# Patient Record
Sex: Male | Born: 1980
Health system: Southern US, Community
[De-identification: ages and names within clinical notes are randomized; demographics above are authoritative.]

## PROBLEM LIST (undated history)

## (undated) HISTORY — PX: BACK SURGERY: SHX140

---

## 2007-10-18 ENCOUNTER — Ambulatory Visit (HOSPITAL_COMMUNITY): Admission: RE | Admit: 2007-10-18 | Discharge: 2007-10-19 | Payer: Self-pay | Admitting: Orthopedic Surgery

## 2010-11-09 NOTE — Op Note (Signed)
NAME:  Zachary Lee, Zachary Lee             ACCOUNT NO.:  [id number]   MEDICAL RECORD NO.:  [id number]          PATIENT TYPE:  AMB   LOCATION:  DAY                          FACILITY:  [facility]   PHYSICIAN:  Georges Lynch. Gioffre, M.D.DATE OF BIRTH:  [birth year]   DATE OF PROCEDURE:  10/18/2007  DATE OF DISCHARGE:                               OPERATIVE REPORT   SURGEON:  Georges Lynch. Darrelyn Hillock, M.D.   ASSISTANT:  Marlowe Kays, M.D.   PREOPERATIVE DIAGNOSIS:  Large herniated lumbar disc at L5-S1 on the  left.  Note, he had left leg pain only along the S1 nerve root  distribution.   POSTOPERATIVE DIAGNOSIS:  Large herniated lumbar disc at L5-S1 on the  left.  Note, he had left leg pain only along the S1 nerve root  distribution.   OPERATION:  Hemilaminectomy and microdiscectomy at L5-S1 on the left,  herniated disc.   PROCEDURE:  Under general anesthesia, routine orthopedic prep and  draping of the lower back was carried out.  With the patient on a spinal  frame, he first had 1 gram of IV Ancef.  At this time, two needles were  placed in the back for localization purposes and an x-ray was taken.  I  then proceeded down over the L5-S1 interspace and the incision was made  there and the muscle was separated from the lamina and spinous process  in the usual fashion.  We separated some of the muscle from the opposite  side in order to put the [facility] retractor blades in for retraction.  Another x-ray was taken to verify the exact space.  We then carried out  our hemilaminectomy in the usual fashion.  We did a foraminotomy for the  S1 root.  We brought the microscope in and gently removed the ligamentum  flavum which was very thick in this area.  Following that, we  decompressed the lateral recess, cauterized the lateral recess veins,  and immediately went down and identified the disc which was an extremely  large herniated disc.  We gently retracted the dura and the nerve root  in the usual  fashion.  A cruciate incision was made in the disc space  and prior to doing that, we noted one loose fragment already present.  We teased that out first with a nerve hook.  Then we went down into the  disc space and we went subligamentous with the nerve hook as well as the  Epstein curettes and cleaned out the subligamentous space, as well, and  then went down and completed our discectomy.  Note, we made multiple  passes in the subligamentous space medially, distally, proximally and  out laterally to make sure we had all the fragments.  The posterior  longitudinal ligament was quite thickened, as well.  We had a nice  decompression of the dura and the nerve root.  Both the dura and the  root moved nice and freely and basically, we were able to easily pass a  hockey stick out the foramina of both the L5 and S1 roots.  We irrigated  the area out.  Good hemostasis  was maintained.  We then loosely applied  some thrombin soaked Gelfoam and closed the wound in layers in the usual  fashion except I left the small distal and small proximal deep parts of  the wound open for drainage purposes.  The subcu was closed with 0  Vicryl and the skin was closed with metal staples and a sterile  Neosporin dressing was applied.          ______________________________  Georges Lynch Darrelyn Hillock, M.D.    RAG/MEDQ  D:  10/18/2007  T:  10/18/2007  Job:  [id number]

## 2011-03-22 LAB — PROTIME-INR: Prothrombin Time: 13.6

## 2011-03-22 LAB — DIFFERENTIAL
Basophils Absolute: 0
Basophils Relative: 1
Eosinophils Absolute: 0.1
Eosinophils Relative: 2

## 2011-03-22 LAB — CBC
Hemoglobin: 16
RBC: 5.37
WBC: 4.9

## 2011-03-22 LAB — COMPREHENSIVE METABOLIC PANEL
ALT: 32
AST: 29
Alkaline Phosphatase: 70
CO2: 28
Chloride: 104
GFR calc Af Amer: >60
GFR calc non Af Amer: >60
Glucose, Bld: 99
Sodium: 140
Total Bilirubin: 0.9

## 2011-03-22 LAB — URINALYSIS, ROUTINE W REFLEX MICROSCOPIC
Bilirubin Urine: NEGATIVE
Glucose, UA: NEGATIVE
Hgb urine dipstick: NEGATIVE
Protein, ur: NEGATIVE

## 2011-12-28 ENCOUNTER — Ambulatory Visit
Admission: RE | Admit: 2011-12-28 | Discharge: 2011-12-28 | Disposition: A | Discharge status: Home / Self Care | Payer: BC Managed Care – PPO | Source: Ambulatory Visit | Attending: Orthopedic Surgery | Admitting: Orthopedic Surgery

## 2011-12-28 ENCOUNTER — Other Ambulatory Visit: Payer: Self-pay | Admitting: Orthopedic Surgery

## 2011-12-28 DIAGNOSIS — M546 Pain in thoracic spine: Secondary | ICD-10-CM

## 2013-05-17 ENCOUNTER — Other Ambulatory Visit: Payer: Self-pay | Admitting: Orthopedic Surgery

## 2013-05-17 DIAGNOSIS — M25511 Pain in right shoulder: Secondary | ICD-10-CM

## 2013-06-03 ENCOUNTER — Ambulatory Visit
Admission: RE | Admit: 2013-06-03 | Discharge: 2013-06-03 | Disposition: A | Discharge status: Home / Self Care | Payer: BC Managed Care – PPO | Source: Ambulatory Visit | Attending: Orthopedic Surgery | Admitting: Orthopedic Surgery

## 2013-06-03 DIAGNOSIS — M25511 Pain in right shoulder: Secondary | ICD-10-CM

## 2013-06-03 MED ORDER — IOHEXOL 180 MG/ML  SOLN
15.0000 mL | Freq: Once | INTRAMUSCULAR | Status: AC | PRN
  Administered 2013-06-03: 15 mL via INTRA_ARTICULAR

## 2015-02-17 ENCOUNTER — Other Ambulatory Visit: Payer: Self-pay | Admitting: Internal Medicine

## 2015-02-17 DIAGNOSIS — R1084 Generalized abdominal pain: Secondary | ICD-10-CM

## 2015-02-20 ENCOUNTER — Ambulatory Visit: Payer: Self-pay

## 2015-02-24 ENCOUNTER — Ambulatory Visit
Admission: RE | Admit: 2015-02-24 | Discharge: 2015-02-24 | Disposition: A | Discharge status: Home / Self Care | Payer: BLUE CROSS/BLUE SHIELD | Source: Ambulatory Visit | Attending: Internal Medicine | Admitting: Internal Medicine

## 2015-02-24 DIAGNOSIS — R1084 Generalized abdominal pain: Secondary | ICD-10-CM

## 2015-02-24 MED ORDER — IOPAMIDOL (ISOVUE-300) INJECTION 61%: 100.0000 mL | Freq: Once | INTRAVENOUS | Status: DC | PRN

## 2016-08-18 DIAGNOSIS — G5762 Lesion of plantar nerve, left lower limb: Secondary | ICD-10-CM | POA: Diagnosis not present

## 2016-08-18 DIAGNOSIS — M7752 Other enthesopathy of left foot: Secondary | ICD-10-CM | POA: Diagnosis not present

## 2016-09-09 DIAGNOSIS — G5762 Lesion of plantar nerve, left lower limb: Secondary | ICD-10-CM | POA: Diagnosis not present

## 2016-09-09 DIAGNOSIS — G5792 Unspecified mononeuropathy of left lower limb: Secondary | ICD-10-CM | POA: Diagnosis not present

## 2016-09-09 DIAGNOSIS — M7752 Other enthesopathy of left foot: Secondary | ICD-10-CM | POA: Diagnosis not present

## 2016-09-15 DIAGNOSIS — M25572 Pain in left ankle and joints of left foot: Secondary | ICD-10-CM | POA: Diagnosis not present

## 2016-09-15 DIAGNOSIS — M545 Low back pain: Secondary | ICD-10-CM | POA: Diagnosis not present

## 2016-10-05 DIAGNOSIS — H04123 Dry eye syndrome of bilateral lacrimal glands: Secondary | ICD-10-CM | POA: Diagnosis not present

## 2016-11-22 DIAGNOSIS — D225 Melanocytic nevi of trunk: Secondary | ICD-10-CM | POA: Diagnosis not present

## 2016-11-22 DIAGNOSIS — B360 Pityriasis versicolor: Secondary | ICD-10-CM | POA: Diagnosis not present

## 2016-11-22 DIAGNOSIS — L720 Epidermal cyst: Secondary | ICD-10-CM | POA: Diagnosis not present

## 2016-11-22 DIAGNOSIS — I788 Other diseases of capillaries: Secondary | ICD-10-CM | POA: Diagnosis not present

## 2016-12-01 DIAGNOSIS — M25561 Pain in right knee: Secondary | ICD-10-CM | POA: Diagnosis not present

## 2016-12-01 DIAGNOSIS — M25562 Pain in left knee: Secondary | ICD-10-CM | POA: Diagnosis not present

## 2016-12-29 DIAGNOSIS — R51 Headache: Secondary | ICD-10-CM | POA: Diagnosis not present

## 2017-01-05 DIAGNOSIS — H5711 Ocular pain, right eye: Secondary | ICD-10-CM | POA: Diagnosis not present

## 2017-01-16 DIAGNOSIS — M25561 Pain in right knee: Secondary | ICD-10-CM | POA: Diagnosis not present

## 2017-02-14 DIAGNOSIS — M722 Plantar fascial fibromatosis: Secondary | ICD-10-CM | POA: Diagnosis not present

## 2017-02-14 DIAGNOSIS — M71571 Other bursitis, not elsewhere classified, right ankle and foot: Secondary | ICD-10-CM | POA: Diagnosis not present

## 2017-02-14 DIAGNOSIS — M7731 Calcaneal spur, right foot: Secondary | ICD-10-CM | POA: Diagnosis not present

## 2017-02-21 DIAGNOSIS — Z0000 Encounter for general adult medical examination without abnormal findings: Secondary | ICD-10-CM | POA: Diagnosis not present

## 2017-03-01 DIAGNOSIS — M722 Plantar fascial fibromatosis: Secondary | ICD-10-CM | POA: Diagnosis not present

## 2017-03-01 DIAGNOSIS — M71571 Other bursitis, not elsewhere classified, right ankle and foot: Secondary | ICD-10-CM | POA: Diagnosis not present

## 2017-03-08 DIAGNOSIS — M722 Plantar fascial fibromatosis: Secondary | ICD-10-CM | POA: Diagnosis not present

## 2017-04-06 DIAGNOSIS — M71571 Other bursitis, not elsewhere classified, right ankle and foot: Secondary | ICD-10-CM | POA: Diagnosis not present

## 2017-04-06 DIAGNOSIS — M722 Plantar fascial fibromatosis: Secondary | ICD-10-CM | POA: Diagnosis not present

## 2017-04-07 DIAGNOSIS — M722 Plantar fascial fibromatosis: Secondary | ICD-10-CM | POA: Diagnosis not present

## 2017-05-01 DIAGNOSIS — M71571 Other bursitis, not elsewhere classified, right ankle and foot: Secondary | ICD-10-CM | POA: Diagnosis not present

## 2017-05-01 DIAGNOSIS — M722 Plantar fascial fibromatosis: Secondary | ICD-10-CM | POA: Diagnosis not present

## 2017-05-23 DIAGNOSIS — L700 Acne vulgaris: Secondary | ICD-10-CM | POA: Diagnosis not present

## 2017-05-23 DIAGNOSIS — B360 Pityriasis versicolor: Secondary | ICD-10-CM | POA: Diagnosis not present

## 2017-05-23 DIAGNOSIS — L309 Dermatitis, unspecified: Secondary | ICD-10-CM | POA: Diagnosis not present

## 2018-01-03 DIAGNOSIS — L237 Allergic contact dermatitis due to plants, except food: Secondary | ICD-10-CM | POA: Diagnosis not present

## 2018-03-03 DIAGNOSIS — R51 Headache: Secondary | ICD-10-CM | POA: Diagnosis not present

## 2018-05-04 DIAGNOSIS — Z0000 Encounter for general adult medical examination without abnormal findings: Secondary | ICD-10-CM | POA: Diagnosis not present

## 2018-09-09 DIAGNOSIS — G44209 Tension-type headache, unspecified, not intractable: Secondary | ICD-10-CM | POA: Diagnosis not present

## 2018-10-25 DIAGNOSIS — H5319 Other subjective visual disturbances: Secondary | ICD-10-CM | POA: Diagnosis not present

## 2019-03-06 DIAGNOSIS — R079 Chest pain, unspecified: Secondary | ICD-10-CM | POA: Diagnosis not present

## 2019-03-07 DIAGNOSIS — I208 Other forms of angina pectoris: Secondary | ICD-10-CM | POA: Diagnosis not present

## 2019-03-07 DIAGNOSIS — R079 Chest pain, unspecified: Secondary | ICD-10-CM | POA: Diagnosis not present

## 2019-03-07 DIAGNOSIS — R0789 Other chest pain: Secondary | ICD-10-CM | POA: Diagnosis not present

## 2019-04-05 ENCOUNTER — Encounter (HOSPITAL_COMMUNITY): Payer: Self-pay

## 2019-04-05 ENCOUNTER — Other Ambulatory Visit: Payer: Self-pay

## 2019-04-05 ENCOUNTER — Emergency Department (HOSPITAL_COMMUNITY)
Admission: EM | Admit: 2019-04-05 | Discharge: 2019-04-05 | Disposition: A | Discharge status: Home / Self Care | Payer: BC Managed Care – PPO | Attending: Emergency Medicine | Admitting: Emergency Medicine

## 2019-04-05 ENCOUNTER — Emergency Department (HOSPITAL_COMMUNITY): Payer: BC Managed Care – PPO

## 2019-04-05 DIAGNOSIS — Z79899 Other long term (current) drug therapy: Secondary | ICD-10-CM | POA: Diagnosis not present

## 2019-04-05 DIAGNOSIS — R109 Unspecified abdominal pain: Secondary | ICD-10-CM

## 2019-04-05 LAB — BASIC METABOLIC PANEL
Anion gap: 12 (ref 5–15)
BUN: 11 mg/dL (ref 6–20)
CO2: 24 mmol/L (ref 22–32)
Calcium: 10 mg/dL (ref 8.9–10.3)
Chloride: 100 mmol/L (ref 98–111)
Creatinine, Ser: 0.8 mg/dL (ref 0.61–1.24)
GFR calc Af Amer: >60 mL/min (ref >60)
GFR calc non Af Amer: >60 mL/min (ref >60)
Glucose, Bld: 110 mg/dL — ABNORMAL HIGH (ref 70–99)
Potassium: 3.9 mmol/L (ref 3.5–5.1)
Sodium: 136 mmol/L (ref 135–145)

## 2019-04-05 LAB — CBC WITH DIFFERENTIAL/PLATELET
Abs Immature Granulocytes: 0.03 10*3/uL (ref 0.00–0.07)
Basophils Absolute: 0.1 10*3/uL (ref 0.0–0.1)
Basophils Relative: 1 %
Eosinophils Absolute: 0.1 10*3/uL (ref 0.0–0.5)
Eosinophils Relative: 1 %
HCT: 52.7 % — ABNORMAL HIGH (ref 39.0–52.0)
Hemoglobin: 17.1 g/dL — ABNORMAL HIGH (ref 13.0–17.0)
Immature Granulocytes: 0 %
Lymphocytes Relative: 13 %
Lymphs Abs: 1.2 10*3/uL (ref 0.7–4.0)
MCH: 28.5 pg (ref 26.0–34.0)
MCHC: 32.4 g/dL (ref 30.0–36.0)
MCV: 87.8 fL (ref 80.0–100.0)
Monocytes Absolute: 0.4 10*3/uL (ref 0.1–1.0)
Monocytes Relative: 4 %
Neutro Abs: 7.5 10*3/uL (ref 1.7–7.7)
Neutrophils Relative %: 81 %
Platelets: 255 10*3/uL (ref 150–400)
RBC: 6 MIL/uL — ABNORMAL HIGH (ref 4.22–5.81)
RDW: 12.7 % (ref 11.5–15.5)
WBC: 9.3 10*3/uL (ref 4.0–10.5)
nRBC: 0 % (ref 0.0–0.2)

## 2019-04-05 LAB — URINALYSIS, ROUTINE W REFLEX MICROSCOPIC
Bilirubin Urine: NEGATIVE
Glucose, UA: NEGATIVE mg/dL
Hgb urine dipstick: NEGATIVE
Ketones, ur: NEGATIVE mg/dL
Leukocytes,Ua: NEGATIVE
Nitrite: NEGATIVE
Protein, ur: NEGATIVE mg/dL
Specific Gravity, Urine: 1.009 (ref 1.005–1.030)
pH: 5 (ref 5.0–8.0)

## 2019-04-05 MED ORDER — METHOCARBAMOL 500 MG PO TABS: 500.0000 mg | ORAL_TABLET | Freq: Two times a day (BID) | ORAL | 0 refills | Status: AC

## 2019-04-05 NOTE — ED Triage Notes (Deleted)
Per patient, states she had increase thirst and urination-went for check up and CBG read high

## 2019-04-05 NOTE — Discharge Instructions (Signed)
Continue Ibuprofen or Naproxen for pain as needed Try Robaxin (muscle relaxer) as needed for muscle pain Try gentle stretching to help with aches Please follow up with your doctor

## 2019-04-05 NOTE — ED Provider Notes (Signed)
[facility] DEPT Provider Note   CSN: [id number] Arrival date & time: 04/05/19  1631     History   Chief Complaint Chief Complaint  Patient presents with  . Flank Pain    HPI Zachary Lee is a 38 y.o. male with history of back surgery presents to the ER from urgent care for evaluation of right flank pain.  Onset 1 week ago.  Described as mild 2/10, intermittent.  He is more aware of the pain when he is up and moving but states movement does not necessarily make it any worse.  Sometimes there is radiation to the right mid abdomen.  No other associated symptoms including fever, nausea, vomiting, diarrhea, constipation, dysuria, changes in urine output.  No recent exertional activity or exercise or falls to cause muscular injury.  No history of kidney stones. No radiation of pain in chest, SOB, pleuritic pain.      HPI  History reviewed. No pertinent past medical history.  There are no active problems to display for this patient.   Past Surgical History:  Procedure Laterality Date  . BACK SURGERY          Home Medications    Prior to Admission medications   Medication Sig Start Date End Date Taking? Authorizing Provider  clonazePAM (KLONOPIN) 0.5 MG tablet Take 0.25 mg by mouth at bedtime as needed. 03/21/19  Yes [provider]  omeprazole (PRILOSEC) 40 MG capsule Take 40 mg by mouth daily. 03/07/19  Yes [provider]    Family History Family History  Problem Relation Age of Onset  . Hypertension Mother   . Cancer Father     Social History Social History   Tobacco Use  . Smoking status: Never Smoker  . Smokeless tobacco: Never Used  Substance Use Topics  . Alcohol use: Yes  . Drug use: Never     Allergies   Penicillins   Review of Systems Review of Systems  Genitourinary: Positive for flank pain.  All other systems reviewed and are negative.    Physical Exam Updated Vital Signs BP (!) 132/98  (BP Location: Right Arm)   Pulse (!) 105   Temp 98.1 F (36.7 C) (Oral)   Resp 14   Ht 6' (1.829 m)   Wt 68 kg   SpO2 100%   BMI 20.34 kg/m   Physical Exam Vitals signs and nursing note reviewed.  Constitutional:      Appearance: He is well-developed.     Comments: Non toxic.  HENT:     Head: Normocephalic and atraumatic.     Nose: Nose normal.  Eyes:     Conjunctiva/sclera: Conjunctivae normal.  Neck:     Musculoskeletal: Normal range of motion.  Cardiovascular:     Rate and Rhythm: Normal rate and regular rhythm.     Heart sounds: Normal heart sounds.  Pulmonary:     Effort: Pulmonary effort is normal.     Breath sounds: Normal breath sounds.  Abdominal:     General: Bowel sounds are normal.     Palpations: Abdomen is soft.     Tenderness: There is no abdominal tenderness.     Comments: No reproducible anterior/posterior abdominal or flank tenderness.  No G/R/R. No suprapubic or CVA tenderness. Negative Murphy's and McBurney's  Musculoskeletal: Normal range of motion.     Comments: TL spine: No midline or paraspinal muscle tenderness.  Full midline lumbar surgical scar noted.  Patient sits up and walks around room  without any discomfort.  No changes to the pain with trunk movements bending/rotation, flexion  Skin:    General: Skin is warm and dry.     Capillary Refill: Capillary refill takes less than 2 seconds.     Comments: Skin is normal to the back/flank.  Neurological:     Mental Status: He is alert.  Psychiatric:        Behavior: Behavior normal.      ED Treatments / Results  Labs (all labs ordered are listed, but only abnormal results are displayed) Labs Reviewed  URINE CULTURE  URINALYSIS, ROUTINE W REFLEX MICROSCOPIC  CBC WITH DIFFERENTIAL/PLATELET  BASIC METABOLIC PANEL    EKG None  Radiology No results found.  Procedures Procedures (including critical care time)  Medications Ordered in ED Medications - No data to display   Initial  Impression / Assessment and Plan / ED Course  I have reviewed the triage vital signs and the nursing notes.  Pertinent labs & imaging results that were available during my care of the patient were reviewed by me and considered in my medical decision making (see chart for details).   Highest on DDX is MSK etiology.  No reproducible muscular, midline tenderness on exam.  Pain is not any worse with movement.  No preceding event however. I considered pyelonephritis and renal stone less likely as he has no other associated GU symptoms.  No CVA or suprapubic abdominal tenderness.  Negative Murphy's and McBurney's.  No radiation into the chest, rib, shortness of breath or pleuritic pain.  Considered cardiovascular/pulmonary process unlikely.   We will obtain labs, UA and CT renal.  1815: Patient will be handed off to oncoming ED PA who will follow-up on labs, UA and CT renal.  Anticipate discharge if benign work-up, no clinical decline with symptomatic management including NSAID, muscle relaxer.  Final Clinical Impressions(s) / ED Diagnoses   Final diagnoses:  Flank pain    ED Discharge Orders    None       Liberty Handy, PA-C 04/05/19 1816    Geoffery Lyons, MD 04/05/19 2109

## 2019-04-05 NOTE — ED Triage Notes (Signed)
Patient c/o intermittent right flank pain x 3-4 days. Patient states he was at an UC today and was told to come to the ED for a CT scan. Patient denies any hematuria or dysuria. patient states he has had more dribbling though.

## 2019-04-05 NOTE — ED Provider Notes (Signed)
38 year old male presents with 1 week of right sided back/flank pain. Has hx of lumbar back surgery. He went to UC prior to coming to the ED asking for blood work but ended up leaving and coming to the ED presumably for imaging and labs. He has no other symptoms besides mild, intermittent pain. Labs, UA, CT scan are pending at shift change.  CBC is remarkable for elevated hgb (17). BMP is normal. UA is normal. CT is normal. Discussed results with patient. He shows me where he is having pain and it seems to be more over the thoracic paraspinal muscles. It's worse with movement but it also somewhat random. The patient is very concerned because he feels like the pain is "deep". He is very anxious because he doesn't feel like it is muscle related. I gave him reassurance and advised gentle stretching and will rx Robaxin. He states he has tried Flexeril and has not tolerated this well before. He has a PCP he can f/u with.   Recardo Evangelist, PA-C 04/05/19 Kennedy Bucker    Carmin Muskrat, MD 04/05/19 [phone]

## 2019-04-07 LAB — URINE CULTURE: Culture: <10000 — AB

## 2019-04-09 DIAGNOSIS — H5319 Other subjective visual disturbances: Secondary | ICD-10-CM | POA: Diagnosis not present

## 2019-04-09 DIAGNOSIS — H35371 Puckering of macula, right eye: Secondary | ICD-10-CM | POA: Diagnosis not present

## 2019-07-17 DIAGNOSIS — M25571 Pain in right ankle and joints of right foot: Secondary | ICD-10-CM | POA: Diagnosis not present

## 2019-10-23 DIAGNOSIS — Z0000 Encounter for general adult medical examination without abnormal findings: Secondary | ICD-10-CM | POA: Diagnosis not present

## 2019-10-23 DIAGNOSIS — R000 Tachycardia, unspecified: Secondary | ICD-10-CM | POA: Diagnosis not present

## 2019-10-23 DIAGNOSIS — Z79899 Other long term (current) drug therapy: Secondary | ICD-10-CM | POA: Diagnosis not present

## 2019-10-23 DIAGNOSIS — M940 Chondrocostal junction syndrome [Tietze]: Secondary | ICD-10-CM | POA: Diagnosis not present

## 2019-10-23 DIAGNOSIS — R079 Chest pain, unspecified: Secondary | ICD-10-CM | POA: Diagnosis not present

## 2020-01-17 DIAGNOSIS — Z23 Encounter for immunization: Secondary | ICD-10-CM | POA: Diagnosis not present

## 2020-01-17 DIAGNOSIS — Z1389 Encounter for screening for other disorder: Secondary | ICD-10-CM | POA: Diagnosis not present

## 2020-01-17 DIAGNOSIS — Z0000 Encounter for general adult medical examination without abnormal findings: Secondary | ICD-10-CM | POA: Diagnosis not present

## 2020-01-17 DIAGNOSIS — Z13220 Encounter for screening for lipoid disorders: Secondary | ICD-10-CM | POA: Diagnosis not present

## 2021-05-24 IMAGING — CT CT RENAL STONE PROTOCOL
2 of 4 series · 16 of 46 positions shown, 18 images · non-contrast
Comparison: CT abdomen pelvis dated February 24, 2015.

CLINICAL DATA: Mildly right flank pain for the past week.

EXAM:
CT ABDOMEN AND PELVIS WITHOUT CONTRAST
TECHNIQUE: Multidetector CT imaging of the abdomen and pelvis was performed
following the standard protocol without IV contrast.

[Series 2: axial st · axial · 0.74mm/px · z∈[+993,+1408]mm · 13 of 93 slices shown, 15 images]
[im 5/93  soft-tissue]
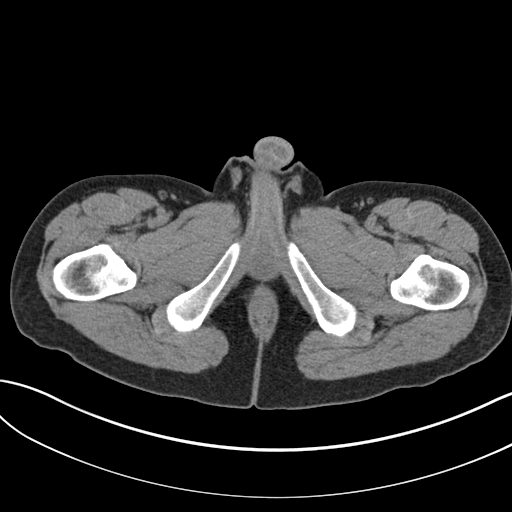
[im 5/93  bone]
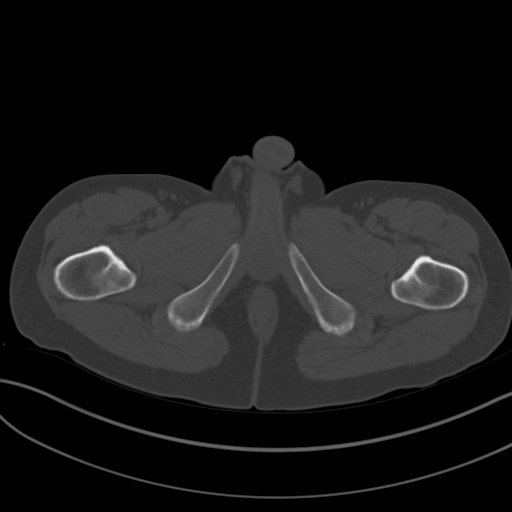
[im 15/93  soft-tissue]
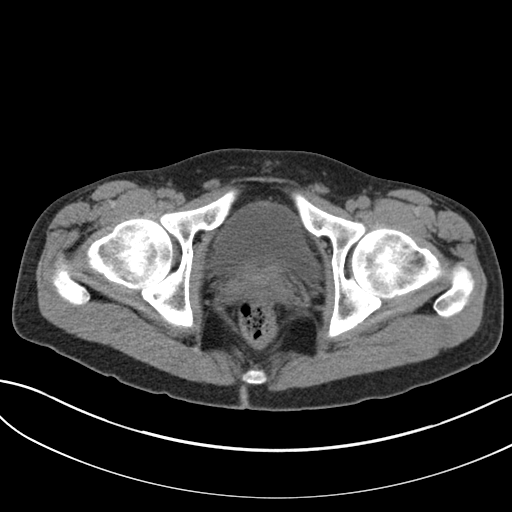
[im 20/93  soft-tissue]
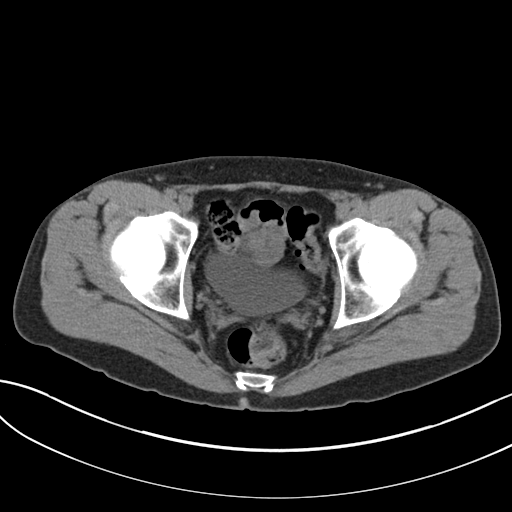
[im 25/93  soft-tissue]
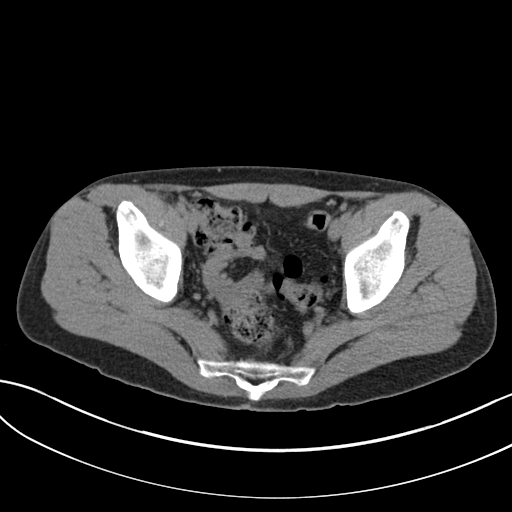
[im 34/93  soft-tissue]
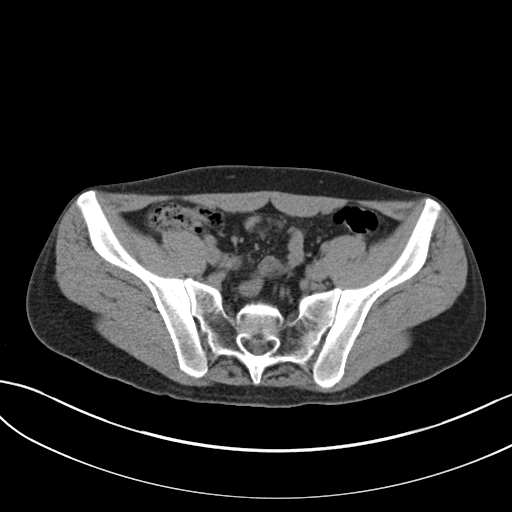
[im 39/93  soft-tissue]
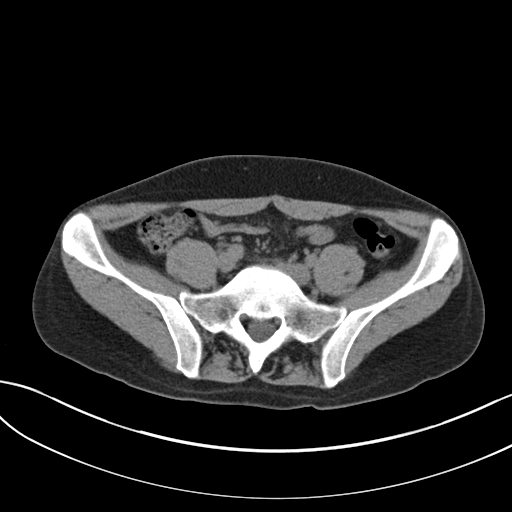
[im 49/93  soft-tissue]
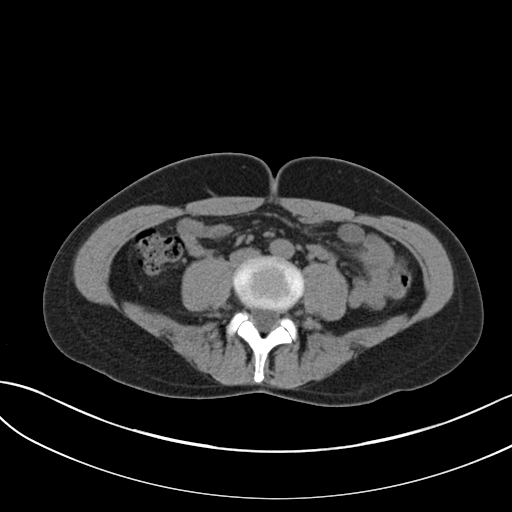
[im 54/93  soft-tissue]
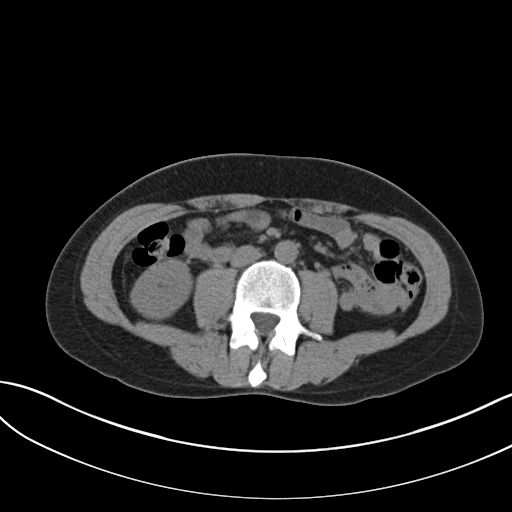
[im 59/93  soft-tissue]
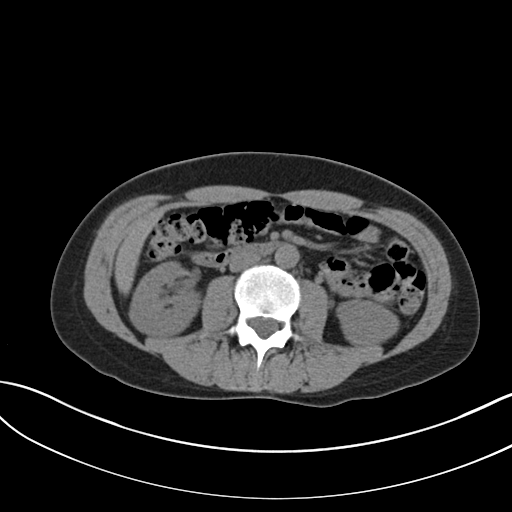
[im 59/93  bone]
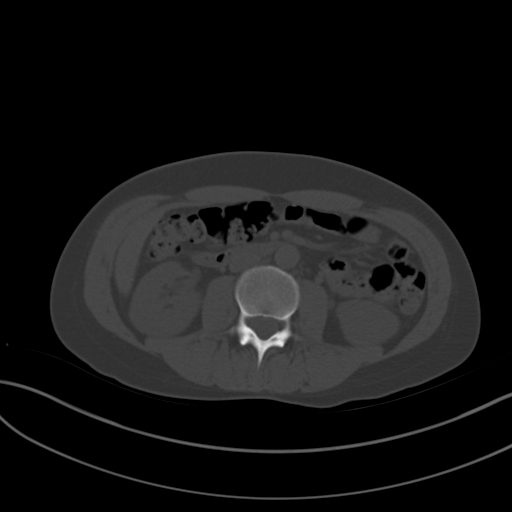
[im 68/93  soft-tissue]
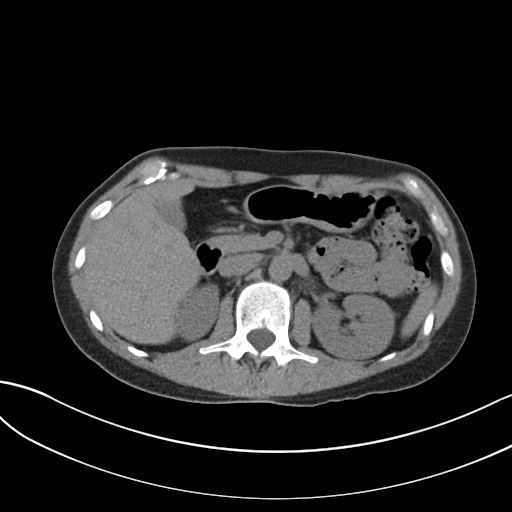
[im 73/93  soft-tissue]
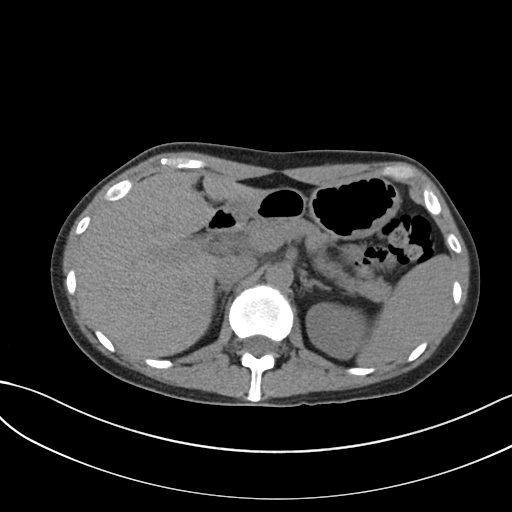
[im 78/93  soft-tissue]
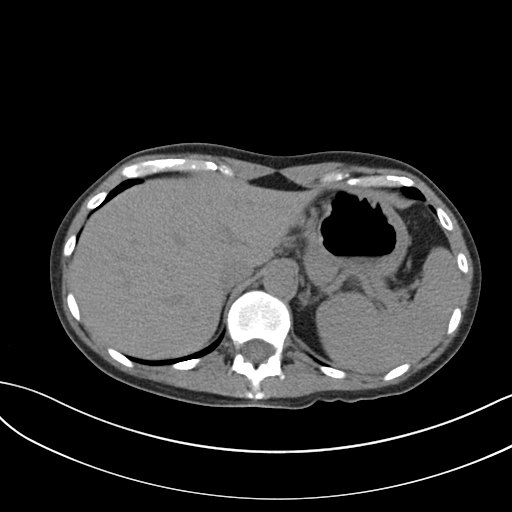
[im 88/93  soft-tissue]
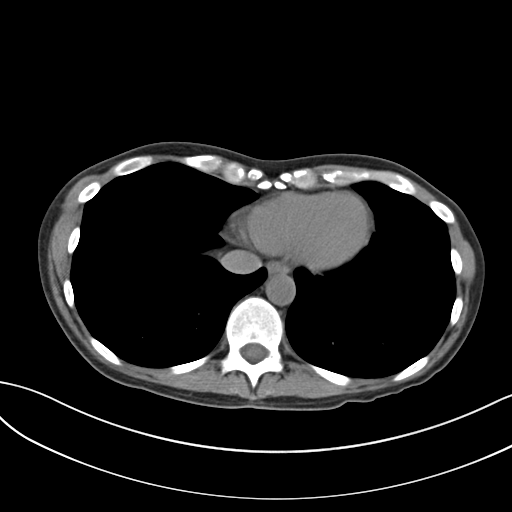

[Series 5: coronal · coronal · 0.68mm/px · 3 of 101 slices shown]
[im 34/101  soft-tissue]
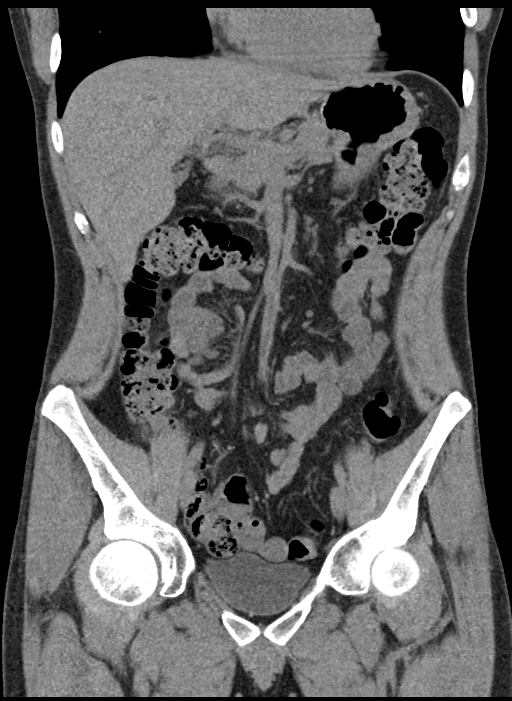
[im 45/101  soft-tissue]
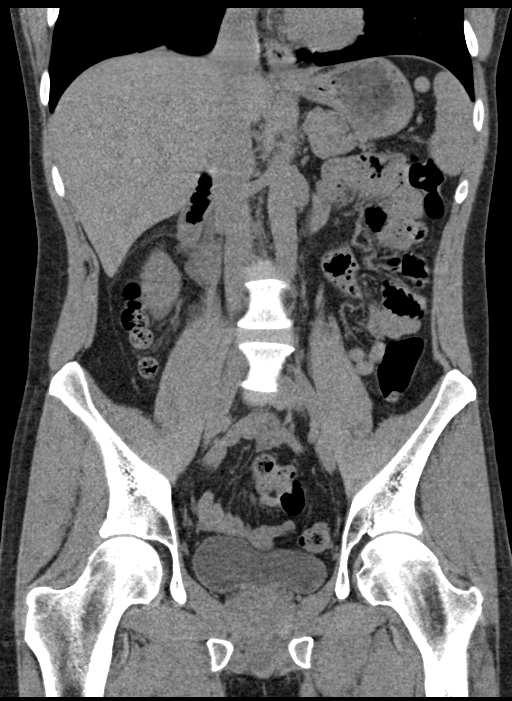
[im 56/101  soft-tissue]
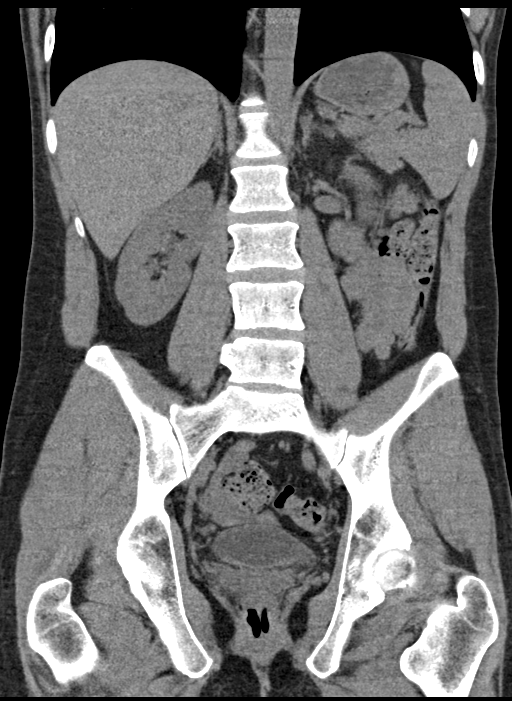

[16 of 46 positions shown; findings below may reference images not displayed]

FINDINGS: Lower chest: No acute abnormality.

Hepatobiliary: No focal liver abnormality is seen. No gallstones,
gallbladder wall thickening, or biliary dilatation.

Pancreas: Unremarkable. No pancreatic ductal dilatation or
surrounding inflammatory changes.

Spleen: Normal in size without focal abnormality.

Adrenals/Urinary Tract: Adrenal glands are unremarkable. Kidneys are
normal, without renal calculi, focal lesion, or hydronephrosis.
Bladder is unremarkable.

Stomach/Bowel: Stomach is within normal limits. Appendix appears
normal. No evidence of bowel wall thickening, distention, or
inflammatory changes.

Vascular/Lymphatic: No significant vascular findings are present. No
enlarged abdominal or pelvic lymph nodes.

Reproductive: Prostate is unremarkable.

Other: No abdominal wall hernia or abnormality. No abdominopelvic
ascites. No pneumoperitoneum.

Musculoskeletal: No acute or significant osseous findings.
IMPRESSION: 1.  No acute intra-abdominal process.  No urolithiasis.
